# Patient Record
Sex: Male | Born: 1995 | Race: White | Hispanic: No | Marital: Single | Smoking: Never smoker
Health system: Southern US, Community
[De-identification: ages and names within clinical notes are randomized; demographics above are authoritative.]

## PROBLEM LIST (undated history)

## (undated) HISTORY — PX: ANTERIOR CRUCIATE LIGAMENT REPAIR: SHX115

---
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
## 2017-01-14 ENCOUNTER — Other Ambulatory Visit: Payer: Self-pay

## 2017-01-14 ENCOUNTER — Emergency Department (HOSPITAL_BASED_OUTPATIENT_CLINIC_OR_DEPARTMENT_OTHER)
Admission: EM | Admit: 2017-01-14 | Discharge: 2017-01-14 | Disposition: A | Discharge status: Home / Self Care | Payer: 59 | Attending: Emergency Medicine | Admitting: Emergency Medicine

## 2017-01-14 ENCOUNTER — Encounter (HOSPITAL_BASED_OUTPATIENT_CLINIC_OR_DEPARTMENT_OTHER): Payer: Self-pay | Admitting: *Deleted

## 2017-01-14 DIAGNOSIS — R112 Nausea with vomiting, unspecified: Secondary | ICD-10-CM | POA: Diagnosis not present

## 2017-01-14 DIAGNOSIS — J029 Acute pharyngitis, unspecified: Secondary | ICD-10-CM | POA: Diagnosis not present

## 2017-01-14 LAB — RAPID STREP SCREEN (MED CTR MEBANE ONLY): STREPTOCOCCUS, GROUP A SCREEN (DIRECT): NEGATIVE

## 2017-01-14 MED ORDER — IBUPROFEN 400 MG PO TABS
600.0000 mg | ORAL_TABLET | Freq: Once | ORAL | Status: AC
  Administered 2017-01-14: 600 mg via ORAL
  Filled 2017-01-14: qty 1

## 2017-01-14 MED ORDER — ONDANSETRON 8 MG PO TBDP
8.0000 mg | ORAL_TABLET | Freq: Once | ORAL | Status: AC
  Administered 2017-01-14: 8 mg via ORAL
  Filled 2017-01-14: qty 1

## 2017-01-14 MED ORDER — ONDANSETRON HCL 4 MG PO TABS: 4.0000 mg | ORAL_TABLET | Freq: Three times a day (TID) | ORAL | 0 refills | Status: AC | PRN

## 2017-01-14 NOTE — Discharge Instructions (Signed)
Please read and follow all provided instructions.  Your diagnoses today include:  1. Non-intractable vomiting with nausea, unspecified vomiting type     Tests performed today include: Vital signs. See below for your results today.   Medications prescribed:   Take any prescribed medications only as directed.  Zofran.  He may take this every 6-8 hours as needed for nausea.  Please alternate ibuprofen and Tylenol around-the-clock.  You may take 600 mg of ibuprofen every 6 hours and 650 mg of Tylenol every 6 hours.  Do not exceed 4 g of Tylenol in 1 day.  Home care instructions:  Follow any educational materials contained in this packet.  Your abdominal pain, nausea, vomiting, and diarrhea may be caused by a viral gastroenteritis also called 'stomach flu'. You should rest for the next several days. Keep drinking plenty of fluids and use the medicine for nausea as directed.   Drink clear liquids for the next 24 hours and introduce solid foods slowly after 24 hours using the b.r.a.t. diet (Bananas, Rice, Applesauce, Toast, Yogurt).    Follow-up instructions: Please follow-up with your primary care provider in the next 2 days for further evaluation of your symptoms. If you are not feeling better in 48 hours you may have a condition that is more serious and you need re-evaluation.   Return instructions:  SEEK IMMEDIATE MEDICAL ATTENTION IF: If you have pain that does not go away or becomes severe  A temperature above 101F develops  Repeated vomiting occurs (multiple episodes)  If you have pain that becomes localized to portions of the abdomen. The right side could possibly be appendicitis. In an adult, the left lower portion of the abdomen could be colitis or diverticulitis.  Blood is being passed in stools or vomit (bright red or black tarry stools)  You develop chest pain, difficulty breathing, dizziness or fainting, or become confused, poorly responsive, or inconsolable (young  children) If you have any other emergent concerns regarding your health  Additional Information: Abdominal (belly) pain can be caused by many things. Your caregiver performed an examination and possibly ordered blood/urine tests and imaging (CT scan, x-rays, ultrasound). Many cases can be observed and treated at home after initial evaluation in the emergency department. Even though you are being discharged home, abdominal pain can be unpredictable. Therefore, you need a repeated exam if your pain does not resolve, returns, or worsens. Most patients with abdominal pain don't have to be admitted to the hospital or have surgery, but serious problems like appendicitis and gallbladder attacks can start out as nonspecific pain. Many abdominal conditions cannot be diagnosed in one visit, so follow-up evaluations are very important.  To find a primary care or specialty doctor please call [phone] or [phone] to access "[facility] Find a Doctor Service."  You may also go on the [facility] website at [email or web address]  There are also multiple [facility] practices throughout the [place] that are frequently accepting new patients. You may find a clinic that is close to your home and contact them.  [facility] - [address] [place] [phone]  [facility] in [place] (also locations in [place] and [place]) - [address] [place] [phone]  [facility] - [address] [place] [phone]    Your vital signs today were: BP 128/64 (BP Location: Right Arm)    Pulse (!) 101    Temp 98.7 F (37.1 C) (Oral)  Resp 18    Ht 6' (1.829 m)    Wt 81.6 kg (180 lb)    SpO2 99%    BMI 24.41 kg/m  If your blood pressure (bp) was elevated above 135/85 this visit, please have this repeated by your doctor within one month. --------------

## 2017-01-14 NOTE — ED Provider Notes (Signed)
[facility] EMERGENCY DEPARTMENT Provider Note   CSN: [id number] Arrival date & time: 01/14/17  1037     History   Chief Complaint Chief Complaint  Patient presents with  . Emesis  . Sore Throat    HPI Samuel Cooper is a 21 y.o. male.  HPI   Patient is an 21 y.o. male with no significant PMH presenting for sore throat and vomiting.  Sore throat began yesterday evening on 01-13-2017.  Patient denies any changes in phonation, difficulty swallowing, difficulty breathing, stiffness in his neck, or stridor.  Patient began vomiting this morning around 6 AM.  Patient has not been able to keep down food or drink since this time.  No focal abdominal tenderness.  No diarrhea.  Patient reports mild headache at present that he attributes to not eating but no visual changes.  No congestion or rhinorrhea.  Patient reports that his mother noted a tactile fever at home, but no recorded fevers or chills at home.  No neck pain or stiffness.  No known sick contacts.  Patient is visiting for Thanksgiving from college in [place].  History reviewed. No pertinent past medical history.  There are no active problems to display for this patient.   Past Surgical History:  Procedure Laterality Date  . ANTERIOR CRUCIATE LIGAMENT REPAIR Left        Home Medications    Prior to Admission medications   Medication Sig Start Date End Date Taking? Authorizing Provider  ondansetron (ZOFRAN) 4 MG tablet Take 1 tablet (4 mg total) by mouth every 8 (eight) hours as needed for nausea or vomiting. 01/14/17   Elisha PonderMurray, Alyssa B, PA-C    Family History No family history on file.  Social History Social History   Tobacco Use  . Smoking status: Never Smoker  . Smokeless tobacco: Never Used  Substance Use Topics  . Alcohol use: No    Frequency: Never  . Drug use: No     Allergies   Patient has no known allergies.   Review of Systems Review of Systems  Constitutional: Positive for fever.  Negative for chills.  HENT: Positive for sore throat. Negative for congestion and rhinorrhea.   Respiratory: Negative for cough, shortness of breath, wheezing and stridor.   Cardiovascular: Negative for chest pain.  Gastrointestinal: Positive for nausea and vomiting. Negative for abdominal pain and diarrhea.  Genitourinary: Negative for dysuria.  Musculoskeletal: Negative for neck pain.  Skin: Negative for rash.     Physical Exam Updated Vital Signs BP 128/64 (BP Location: Right Arm)   Pulse (!) 101   Temp 98.7 F (37.1 C) (Oral)   Resp 18   Ht 6' (1.829 m)   Wt 81.6 kg (180 lb)   SpO2 99%   BMI 24.41 kg/m   Physical Exam  Constitutional: He appears well-developed and well-nourished. No distress.  Sitting comfortably in bed.  HENT:  Head: Normocephalic and atraumatic.  Mouth/Throat: Uvula is midline, oropharynx is clear and moist and mucous membranes are normal. No oral lesions. No uvula swelling. No oropharyngeal exudate, posterior oropharyngeal edema or tonsillar abscesses. Tonsils are 2+ on the right. Tonsils are 2+ on the left. No tonsillar exudate.  Normal phonation. No muffled voice sounds. Patient swallows secretions without difficulty. Dentition normal. No lesions of tongue or buccal mucosa. Uvula midline. No asymmetric swelling of the posterior pharynx.No erythema of posterior pharynx. No tonsillar exuduate. No lingual swelling. No induration inferior to tongue. No submandibular tenderness, swelling, or induration.  Tissues of  the neck supple. No cervical lymphadenopathy.   Eyes: Conjunctivae are normal. Right eye exhibits no discharge. Left eye exhibits no discharge.  EOMs normal to gross examination.  Neck: Normal range of motion.  Cardiovascular: Normal rate and regular rhythm.  No murmur heard. Pulmonary/Chest: Effort normal and breath sounds normal.  Normal respiratory effort. Patient converses comfortably. No audible wheeze or stridor.  Abdominal: He exhibits no  distension.  Musculoskeletal: Normal range of motion.  Neurological: He is alert.  Cranial nerves intact to gross observation. Patient moves extremities without difficulty.  Skin: Skin is warm and dry. He is not diaphoretic.  Psychiatric: He has a normal mood and affect. His behavior is normal. Judgment and thought content normal.  Nursing note and vitals reviewed.    ED Treatments / Results  Labs (all labs ordered are listed, but only abnormal results are displayed) Labs Reviewed  RAPID STREP SCREEN (NOT AT [facility])  CULTURE, GROUP A STREP [facility])    EKG  EKG Interpretation None       Radiology No results found.  Procedures Procedures (including critical care time)  Medications Ordered in ED Medications  ondansetron (ZOFRAN-ODT) disintegrating tablet 8 mg (8 mg Oral Given 01/14/17 1156)  ibuprofen (ADVIL,MOTRIN) tablet 600 mg (600 mg Oral Given 01/14/17 1313)     Initial Impression / Assessment and Plan / ED Course  I have reviewed the triage vital signs and the nursing notes.  Pertinent labs & imaging results that were available during my care of the patient were reviewed by me and considered in my medical decision making (see chart for details).     Final Clinical Impressions(s) / ED Diagnoses   Final diagnoses:  Non-intractable vomiting with nausea, unspecified vomiting type  Sore throat   Patient is nontoxic-appearing, afebrile, and in no acute distress.  Mucous membranes are moist.  Will provide ODT Zofran and p.o. challenge.  Ibuprofen for pain control when nausea is under control.  Pulse checked by me and 106 in room.  Temperature 100.0 orally, checked by me.  There are no concerning features for Ludwig's angina or peritonsillar abscess on exam.  Rapid strep is negative. Patient has normal phonation.  Uvula is midline.  Patient with likely viral pharyngitis.  Abdominal exam is benign.  On multiple re-evaluations, patient was able to tolerate p.o. intake.   Nausea completely improved in the emergency department with ODT Zofran.  Tachycardia resolved to pulse of 95 on reevaluation.  Tachycardia was likely secondary to low-grade temperature elevation.  Patient stable for discharge with Zofran prescription.  I discussed with patient and family that should rapid strep returned positive in the next couple days, he will receive a call from pharmacy for antibiotic prescription.  Return precautions given for any difficulty breathing, difficulty swallowing, intractable nausea or vomiting, patient and his family are in understanding and agree with the plan of care.  ED Discharge Orders        Ordered    ondansetron (ZOFRAN) 4 MG tablet  Every 8 hours PRN     01/14/17 1412       Delia ChimesMurray, Alyssa B, PA-C 01/14/17 1657    Rolan BuccoBelfi, Melanie, MD 01/15/17 [phone]

## 2017-01-14 NOTE — ED Triage Notes (Signed)
Patient states he developed a sore throat yesterday.  States this morning around 0600 he developed vomiting.  Now after drinking anything he vomits.  Took Dayquil today at 0600.

## 2017-01-15 LAB — CULTURE, GROUP A STREP (THRC)
